# Patient Record
Sex: Male | Born: 1995 | Race: White | Hispanic: No | Marital: Single | Smoking: Never smoker
Health system: Southern US, Community
[De-identification: ages and names within clinical notes are randomized; demographics above are authoritative.]

## PROBLEM LIST (undated history)

## (undated) HISTORY — PX: APPENDECTOMY: SHX54

---

## 2019-05-17 ENCOUNTER — Emergency Department (HOSPITAL_COMMUNITY): Payer: Worker's Compensation

## 2019-05-17 ENCOUNTER — Observation Stay (HOSPITAL_COMMUNITY)
Admission: EM | Admit: 2019-05-17 | Discharge: 2019-05-18 | Disposition: A | Discharge status: Home / Self Care | Payer: Worker's Compensation | Attending: Emergency Medicine | Admitting: Emergency Medicine

## 2019-05-17 ENCOUNTER — Other Ambulatory Visit: Payer: Self-pay

## 2019-05-17 DIAGNOSIS — S02412B LeFort II fracture, initial encounter for open fracture: Secondary | ICD-10-CM | POA: Diagnosis present

## 2019-05-17 DIAGNOSIS — S27322A Contusion of lung, bilateral, initial encounter: Secondary | ICD-10-CM | POA: Insufficient documentation

## 2019-05-17 DIAGNOSIS — Z1159 Encounter for screening for other viral diseases: Secondary | ICD-10-CM | POA: Insufficient documentation

## 2019-05-17 DIAGNOSIS — S0240FA Zygomatic fracture, left side, initial encounter for closed fracture: Secondary | ICD-10-CM | POA: Insufficient documentation

## 2019-05-17 DIAGNOSIS — S0990XA Unspecified injury of head, initial encounter: Secondary | ICD-10-CM | POA: Diagnosis present

## 2019-05-17 DIAGNOSIS — S060X9A Concussion with loss of consciousness of unspecified duration, initial encounter: Secondary | ICD-10-CM | POA: Diagnosis not present

## 2019-05-17 LAB — SARS CORONAVIRUS 2 BY RT PCR (HOSPITAL ORDER, PERFORMED IN ~~LOC~~ HOSPITAL LAB): SARS Coronavirus 2: NEGATIVE

## 2019-05-17 LAB — I-STAT CHEM 8, ED
BUN: 25 mg/dL — ABNORMAL HIGH (ref 6–20)
Calcium, Ion: 1.03 mmol/L — ABNORMAL LOW (ref 1.15–1.40)
Chloride: 106 mmol/L (ref 98–111)
Creatinine, Ser: 0.9 mg/dL (ref 0.61–1.24)
Glucose, Bld: 135 mg/dL — ABNORMAL HIGH (ref 70–99)
HCT: 42 % (ref 39.0–52.0)
Hemoglobin: 14.3 g/dL (ref 13.0–17.0)
Potassium: 4.7 mmol/L (ref 3.5–5.1)
Sodium: 139 mmol/L (ref 135–145)
TCO2: 25 mmol/L (ref 22–32)

## 2019-05-17 LAB — URINALYSIS, ROUTINE W REFLEX MICROSCOPIC
Bacteria, UA: NONE SEEN
Bilirubin Urine: NEGATIVE
Glucose, UA: NEGATIVE mg/dL
Ketones, ur: NEGATIVE mg/dL
Leukocytes,Ua: NEGATIVE
Nitrite: NEGATIVE
Protein, ur: NEGATIVE mg/dL
Specific Gravity, Urine: 1.03 (ref 1.005–1.030)
pH: 6 (ref 5.0–8.0)

## 2019-05-17 LAB — COMPREHENSIVE METABOLIC PANEL
ALT: 14 U/L (ref 0–44)
AST: 22 U/L (ref 15–41)
Albumin: 3.7 g/dL (ref 3.5–5.0)
Alkaline Phosphatase: 49 U/L (ref 38–126)
Anion gap: 9 (ref 5–15)
BUN: 18 mg/dL (ref 6–20)
CO2: 25 mmol/L (ref 22–32)
Calcium: 8.8 mg/dL — ABNORMAL LOW (ref 8.9–10.3)
Chloride: 106 mmol/L (ref 98–111)
Creatinine, Ser: 1.05 mg/dL (ref 0.61–1.24)
GFR calc Af Amer: >60 mL/min (ref >60)
GFR calc non Af Amer: >60 mL/min (ref >60)
Glucose, Bld: 137 mg/dL — ABNORMAL HIGH (ref 70–99)
Potassium: 3.9 mmol/L (ref 3.5–5.1)
Sodium: 140 mmol/L (ref 135–145)
Total Bilirubin: 0.7 mg/dL (ref 0.3–1.2)
Total Protein: 5.8 g/dL — ABNORMAL LOW (ref 6.5–8.1)

## 2019-05-17 LAB — PREPARE FRESH FROZEN PLASMA
Unit division: 0
Unit division: 0

## 2019-05-17 LAB — ABO/RH: ABO/RH(D): O POS

## 2019-05-17 LAB — BPAM FFP
Blood Product Expiration Date: 202008082359
Blood Product Expiration Date: 202008082359
ISSUE DATE / TIME: 202007200914
ISSUE DATE / TIME: 202007200914
Unit Type and Rh: 600
Unit Type and Rh: 6200

## 2019-05-17 LAB — CBC
HCT: 44.9 % (ref 39.0–52.0)
Hemoglobin: 15 g/dL (ref 13.0–17.0)
MCH: 29.8 pg (ref 26.0–34.0)
MCHC: 33.4 g/dL (ref 30.0–36.0)
MCV: 89.3 fL (ref 80.0–100.0)
Platelets: 201 10*3/uL (ref 150–400)
RBC: 5.03 MIL/uL (ref 4.22–5.81)
RDW: 12.4 % (ref 11.5–15.5)
WBC: 10.1 10*3/uL (ref 4.0–10.5)
nRBC: 0 % (ref 0.0–0.2)

## 2019-05-17 LAB — ETHANOL: Alcohol, Ethyl (B): <10 mg/dL (ref <10)

## 2019-05-17 LAB — PROTIME-INR
INR: 1.1 (ref 0.8–1.2)
Prothrombin Time: 14.3 seconds (ref 11.4–15.2)

## 2019-05-17 LAB — LACTIC ACID, PLASMA: Lactic Acid, Venous: 2.5 mmol/L (ref 0.5–1.9)

## 2019-05-17 LAB — CDS SEROLOGY

## 2019-05-17 MED ORDER — OXYCODONE HCL 5 MG PO TABS: 5.0000 mg | ORAL_TABLET | ORAL | Status: DC | PRN

## 2019-05-17 MED ORDER — ONDANSETRON 4 MG PO TBDP: 4.0000 mg | ORAL_TABLET | Freq: Four times a day (QID) | ORAL | Status: DC | PRN

## 2019-05-17 MED ORDER — CLINDAMYCIN PHOSPHATE 600 MG/50ML IV SOLN
600.0000 mg | Freq: Two times a day (BID) | INTRAVENOUS | Status: DC
  Administered 2019-05-17: 600 mg via INTRAVENOUS
  Filled 2019-05-17: qty 50

## 2019-05-17 MED ORDER — HYDROMORPHONE HCL 1 MG/ML IJ SOLN: 1.0000 mg | INTRAMUSCULAR | Status: DC | PRN

## 2019-05-17 MED ORDER — METOPROLOL TARTRATE 5 MG/5ML IV SOLN: 5.0000 mg | Freq: Four times a day (QID) | INTRAVENOUS | Status: DC | PRN

## 2019-05-17 MED ORDER — ACETAMINOPHEN 325 MG PO TABS
650.0000 mg | ORAL_TABLET | Freq: Four times a day (QID) | ORAL | Status: DC
  Administered 2019-05-17 – 2019-05-18 (×3): 650 mg via ORAL
  Filled 2019-05-17 (×3): qty 2

## 2019-05-17 MED ORDER — ONDANSETRON HCL 4 MG/2ML IJ SOLN
INTRAMUSCULAR | Status: AC | PRN
  Administered 2019-05-17: 4 mg via INTRAVENOUS

## 2019-05-17 MED ORDER — OXYMETAZOLINE HCL 0.05 % NA SOLN
1.0000 | NASAL | Status: DC | PRN
  Filled 2019-05-17: qty 30

## 2019-05-17 MED ORDER — ONDANSETRON HCL 4 MG/2ML IJ SOLN
INTRAMUSCULAR | Status: AC
  Filled 2019-05-17: qty 2

## 2019-05-17 MED ORDER — TETANUS-DIPHTH-ACELL PERTUSSIS 5-2.5-18.5 LF-MCG/0.5 IM SUSP
0.5000 mL | Freq: Once | INTRAMUSCULAR | Status: DC
  Filled 2019-05-17: qty 0.5

## 2019-05-17 MED ORDER — ONDANSETRON HCL 4 MG/2ML IJ SOLN: 4.0000 mg | Freq: Four times a day (QID) | INTRAMUSCULAR | Status: DC | PRN

## 2019-05-17 MED ORDER — SODIUM CHLORIDE 0.9 % IV SOLN
INTRAVENOUS | Status: DC
  Administered 2019-05-17 – 2019-05-18 (×2): via INTRAVENOUS

## 2019-05-17 MED ORDER — FENTANYL CITRATE (PF) 100 MCG/2ML IJ SOLN
50.0000 ug | Freq: Once | INTRAMUSCULAR | Status: AC
  Administered 2019-05-17: 14:00:00 50 ug via INTRAVENOUS
  Filled 2019-05-17: qty 2

## 2019-05-17 MED ORDER — IOHEXOL 300 MG/ML  SOLN
75.0000 mL | Freq: Once | INTRAMUSCULAR | Status: AC | PRN
  Administered 2019-05-17: 75 mL via INTRAVENOUS

## 2019-05-17 MED ORDER — FENTANYL CITRATE (PF) 100 MCG/2ML IJ SOLN
INTRAMUSCULAR | Status: AC | PRN
  Administered 2019-05-17: 50 ug via INTRAVENOUS

## 2019-05-17 MED ORDER — CLINDAMYCIN PHOSPHATE 600 MG/50ML IV SOLN
600.0000 mg | Freq: Once | INTRAVENOUS | Status: AC
  Administered 2019-05-17: 13:00:00 600 mg via INTRAVENOUS
  Filled 2019-05-17: qty 50

## 2019-05-17 MED ORDER — FENTANYL CITRATE (PF) 100 MCG/2ML IJ SOLN
INTRAMUSCULAR | Status: AC
  Administered 2019-05-17: 14:00:00 50 ug via INTRAVENOUS
  Filled 2019-05-17: qty 2
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
## 2019-05-17 NOTE — ED Notes (Signed)
Upon assessing patient, it was noted that patient had self removed C-Collar. He stated that he removed it due to discomfort. c-collar replaced at this time and patient educated on keeping collar until doctor gives further orders.
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
## 2019-05-17 NOTE — Plan of Care (Signed)
  Problem: Health Behavior/Discharge Planning: Goal: Ability to manage health-related needs will improve Outcome: Progressing   
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
## 2019-05-17 NOTE — ED Notes (Signed)
Patient transported to CT 
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
## 2019-05-18 ENCOUNTER — Observation Stay (HOSPITAL_COMMUNITY): Payer: Worker's Compensation

## 2019-05-18 LAB — TYPE AND SCREEN
ABO/RH(D): O POS
Antibody Screen: NEGATIVE
Unit division: 0
Unit division: 0

## 2019-05-18 LAB — COMPREHENSIVE METABOLIC PANEL
ALT: 13 U/L (ref 0–44)
AST: 14 U/L — ABNORMAL LOW (ref 15–41)
Albumin: 3.5 g/dL (ref 3.5–5.0)
Alkaline Phosphatase: 46 U/L (ref 38–126)
Anion gap: 9 (ref 5–15)
BUN: 15 mg/dL (ref 6–20)
CO2: 25 mmol/L (ref 22–32)
Calcium: 9 mg/dL (ref 8.9–10.3)
Chloride: 107 mmol/L (ref 98–111)
Creatinine, Ser: 0.89 mg/dL (ref 0.61–1.24)
GFR calc Af Amer: >60 mL/min (ref >60)
GFR calc non Af Amer: >60 mL/min (ref >60)
Glucose, Bld: 91 mg/dL (ref 70–99)
Potassium: 3.6 mmol/L (ref 3.5–5.1)
Sodium: 141 mmol/L (ref 135–145)
Total Bilirubin: 1.5 mg/dL — ABNORMAL HIGH (ref 0.3–1.2)
Total Protein: 5.8 g/dL — ABNORMAL LOW (ref 6.5–8.1)

## 2019-05-18 LAB — CBC
HCT: 38.6 % — ABNORMAL LOW (ref 39.0–52.0)
Hemoglobin: 13.4 g/dL (ref 13.0–17.0)
MCH: 30.2 pg (ref 26.0–34.0)
MCHC: 34.7 g/dL (ref 30.0–36.0)
MCV: 86.9 fL (ref 80.0–100.0)
Platelets: 176 10*3/uL (ref 150–400)
RBC: 4.44 MIL/uL (ref 4.22–5.81)
RDW: 12.7 % (ref 11.5–15.5)
WBC: 14.3 10*3/uL — ABNORMAL HIGH (ref 4.0–10.5)
nRBC: 0 % (ref 0.0–0.2)

## 2019-05-18 LAB — BPAM RBC
Blood Product Expiration Date: 202008172359
Blood Product Expiration Date: 202008172359
ISSUE DATE / TIME: 202007202222
ISSUE DATE / TIME: 202007202222
Unit Type and Rh: 5100
Unit Type and Rh: 5100

## 2019-05-18 LAB — HIV ANTIBODY (ROUTINE TESTING W REFLEX): HIV Screen 4th Generation wRfx: NONREACTIVE

## 2019-05-18 MED ORDER — OXYCODONE HCL 5 MG PO TABS: 5.0000 mg | ORAL_TABLET | Freq: Four times a day (QID) | ORAL | 0 refills | Status: AC | PRN

## 2019-05-18 MED ORDER — ENOXAPARIN SODIUM 30 MG/0.3ML ~~LOC~~ SOLN: 30.0000 mg | SUBCUTANEOUS | Status: DC

## 2019-05-18 MED ORDER — IBUPROFEN 600 MG PO TABS: 600.0000 mg | ORAL_TABLET | Freq: Four times a day (QID) | ORAL | Status: DC | PRN

## 2019-05-18 MED ORDER — ACETAMINOPHEN 325 MG PO TABS: 650.0000 mg | ORAL_TABLET | Freq: Four times a day (QID) | ORAL | Status: AC | PRN

## 2019-05-18 MED ORDER — CEPHALEXIN 500 MG PO CAPS: 500.0000 mg | ORAL_CAPSULE | Freq: Two times a day (BID) | ORAL | 0 refills | Status: AC

## 2019-05-18 NOTE — Progress Notes (Signed)
ENT Follow UP I was able to review the CT scan with the radiologist. Patient has LeFort II fracture on the right side with minimal displacement and essentially no displacement on the left side. He should heal up with minimal cosmetic changes. He should be able to be discharged today but no nose blowing for 5 days, soft diet and antibiotics for 1 week. Keflex 500 mg bid x 1 week. Will have him follow up in my office in 1 week.
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
## 2019-05-18 NOTE — Progress Notes (Addendum)
Patient discharged to home. Verbalizes understanding of all discharge instructions including right side of face/edema, discharge medications, and follow up MD visits. Patient provided with ice pack to take home. Transported down to family by wheelchair.

## 2019-05-18 NOTE — Discharge Instructions (Addendum)
Soft diet only Don't blow your nose hard for the next week. Keflex 500 mg bid for the next week Follow up with Dr Ezzard StandingNewman next Tuesday at 12:45 at his office Apply compress to right side of your face for the next 24 hrs Apply antibiotic ointment to the abrasions   Soft-Food Eating Plan A soft-food eating plan includes foods that are safe and easy to chew and swallow. Your health care provider or dietitian can help you find foods and flavors that fit into this plan. Follow this plan until your health care provider or dietitian says it is safe to start eating other foods and food textures. What are tips for following this plan? General guidelines   Take small bites of food, or cut food into pieces about  inch or smaller. Bite-sized pieces of food are easier to chew and swallow.  Eat moist foods. Avoid overly dry foods.  Avoid foods that: ? Are difficult to swallow, such as dry, chunky, crispy, or sticky foods. ? Are difficult to chew, such as hard, tough, or stringy foods. ? Contain nuts, seeds, or fruits.  Follow instructions from your dietitian about the types of liquids that are safe for you to swallow. You may be allowed to have: ? Thick liquids only. This includes only liquids that are thicker than honey. ? Thin and thick liquids. This includes all beverages and foods that become liquid at room temperature.  To make thick liquids: ? Purchase a commercial liquid thickening powder. These are available at grocery stores and pharmacies. ? Mix the thickener into liquids according to instructions on the label. ? Purchase ready-made thickened liquids. ? Thicken soup by pureeing, straining to remove chunks, and adding flour, potato flakes, or corn starch. ? Add commercial thickener to foods that become liquid at room temperature, such as milk shakes, yogurt, ice cream, gelatin, and sherbet.  Ask your health care provider whether you need to take a fiber supplement. Cooking  Cook meats  so they stay tender and moist. Use methods like braising, stewing, or baking in liquid.  Cook vegetables and fruit until they are soft enough to be mashed with a fork.  Peel soft, fresh fruits such as peaches, nectarines, and melons.  When making soup, make sure chunks of meat and vegetables are smaller than  inch.  Reheat leftover foods slowly so that a tough crust does not form. What foods are allowed? The items listed below may not be a complete list. Talk with your dietitian about what dietary choices are best for you. Grains Breads, muffins, pancakes, or waffles moistened with syrup, jelly, or butter. Dry cereals well-moistened with milk. Moist, cooked cereals. Well-cooked pasta and rice. Vegetables All soft-cooked vegetables. Shredded lettuce. Fruits All canned and cooked fruits. Soft, peeled fresh fruits. Strawberries. Dairy Milk. Cream. Yogurt. Cottage cheese. Soft cheese without the rind. Meats and other protein foods Tender, moist ground meat, poultry, or fish. Meat cooked in gravy or sauces. Eggs. Sweets and desserts Ice cream. Milk shakes. Sherbet. Pudding. Fats and oils Butter. Margarine. Olive, canola, sunflower, and grapeseed oil. Smooth salad dressing. Smooth cream cheese. Mayonnaise. Gravy. What foods are not allowed? The items listed bemay not be a complete list. Talk with your dietitian about what dietary choices are best for you. Grains Coarse or dry cereals, such as bran, granola, and shredded wheat. Tough or chewy crusty breads, such as [place] bread or baguettes. Breads with nuts, seeds, or fruit. Vegetables All raw vegetables. Cooked corn. Cooked vegetables that are  tough or stringy. Tough, crisp, fried potatoes and potato skins. Fruits Fresh fruits with skins or seeds, or both, such as apples, pears, and grapes. Stringy, high-pulp fruits, such as papaya, pineapple, coconut, and mango. Fruit leather and all dried fruit. Dairy Yogurt with nuts or  coconut. Meats and other protein foods Hard, dry sausages. Dry meat, poultry, or fish. Meats with gristle. Fish with bones. Fried meat or fish. Lunch meat and hotdogs. Nuts and seeds. Chunky peanut butter or other nut butters. Sweets and desserts Cakes or cookies that are very dry or chewy. Desserts with dried fruit, nuts, or coconut. Fried pastries. Very rich pastries. Fats and oils Cream cheese with fruit or nuts. Salad dressings with seeds or chunks. Summary  A soft-food eating plan includes foods that are safe and easy to swallow. Generally, the foods should be soft enough to be mashed with a fork.  Avoid foods that are dry, hard to chew, crunchy, sticky, stringy, or crispy.  Ask your health care provider whether you need to thicken your liquids and if you need to take a fiber supplement. This information is not intended to replace advice given to you by your health care provider. Make sure you discuss any questions you have with your health care provider. Document Released: 01/21/2008 Document Revised: 02/04/2019 Document Reviewed: 12/17/2016 Elsevier Patient Education  2020 [employer].

## 2019-06-17 ENCOUNTER — Emergency Department (HOSPITAL_COMMUNITY)
Admission: EM | Admit: 2019-06-17 | Discharge: 2019-06-17 | Disposition: A | Discharge status: Home / Self Care | Payer: Worker's Compensation | Attending: Emergency Medicine | Admitting: Emergency Medicine

## 2019-06-17 ENCOUNTER — Other Ambulatory Visit: Payer: Self-pay

## 2019-06-17 ENCOUNTER — Emergency Department (HOSPITAL_COMMUNITY): Payer: Worker's Compensation

## 2019-06-17 ENCOUNTER — Encounter (HOSPITAL_COMMUNITY): Payer: Self-pay | Admitting: Emergency Medicine

## 2019-06-17 DIAGNOSIS — R0602 Shortness of breath: Secondary | ICD-10-CM | POA: Diagnosis not present

## 2019-06-17 DIAGNOSIS — Z20828 Contact with and (suspected) exposure to other viral communicable diseases: Secondary | ICD-10-CM | POA: Diagnosis not present

## 2019-06-17 LAB — BASIC METABOLIC PANEL
Anion gap: 11 (ref 5–15)
BUN: 20 mg/dL (ref 6–20)
CO2: 24 mmol/L (ref 22–32)
Calcium: 9.9 mg/dL (ref 8.9–10.3)
Chloride: 105 mmol/L (ref 98–111)
Creatinine, Ser: 1.01 mg/dL (ref 0.61–1.24)
GFR calc Af Amer: >60 mL/min (ref >60)
GFR calc non Af Amer: >60 mL/min (ref >60)
Glucose, Bld: 98 mg/dL (ref 70–99)
Potassium: 4.2 mmol/L (ref 3.5–5.1)
Sodium: 140 mmol/L (ref 135–145)

## 2019-06-17 LAB — SARS CORONAVIRUS 2 BY RT PCR (HOSPITAL ORDER, PERFORMED IN ~~LOC~~ HOSPITAL LAB): SARS Coronavirus 2: NEGATIVE

## 2019-06-17 LAB — CBC
HCT: 47 % (ref 39.0–52.0)
Hemoglobin: 15.7 g/dL (ref 13.0–17.0)
MCH: 29.8 pg (ref 26.0–34.0)
MCHC: 33.4 g/dL (ref 30.0–36.0)
MCV: 89.2 fL (ref 80.0–100.0)
Platelets: 213 10*3/uL (ref 150–400)
RBC: 5.27 MIL/uL (ref 4.22–5.81)
RDW: 12.6 % (ref 11.5–15.5)
WBC: 8.4 10*3/uL (ref 4.0–10.5)
nRBC: 0 % (ref 0.0–0.2)

## 2019-06-17 LAB — TROPONIN I (HIGH SENSITIVITY)
Troponin I (High Sensitivity): 2 ng/L (ref <18)
Troponin I (High Sensitivity): 2 ng/L (ref <18)

## 2019-06-17 MED ORDER — SODIUM CHLORIDE 0.9% FLUSH: 3.0000 mL | Freq: Once | INTRAVENOUS | Status: DC

## 2019-06-17 MED ORDER — IOHEXOL 350 MG/ML SOLN
100.0000 mL | Freq: Once | INTRAVENOUS | Status: AC | PRN
  Administered 2019-06-17: 100 mL via INTRAVENOUS

## 2019-06-17 NOTE — ED Notes (Signed)
MD Diona Browner called to request Covid testing prior to surgery scheduled for tmrw.

## 2019-06-17 NOTE — Pre-Procedure Instructions (Signed)
    [facility]  06/17/2019      [id number] [address], [place] [address] Phone: [phone] Fax: [phone]    Your procedure is scheduled on Friday, June 18, 2019  Report to [facility] Admitting at 5: 30 A.M.  Call this number if you have problems the morning of surgery:  [phone]   Remember: Brush your teeth the morning of surgery with your regular toothpaste.   Do not eat or drink after midnight.     Take these medicines the morning of surgery with A SIP OF WATER :  If needed: acetaminophen (TYLENOL) for pain or headache  Stop taking Aspirin (unless otherwise advised by surgeon), vitamins, fish oil and herbal medications. Do not take any NSAIDs ie: Ibuprofen, Advil, Naproxen (Aleve), Motrin, BC and Goody Powder; stop now.   Do not wear jewelry, make-up or nail polish.  Do not wear lotions, powders, or perfumes, or deodorant.  Do not shave 48 hours prior to surgery.  Men may shave face and neck.  Do not bring valuables to the hospital.  [facility] is not responsible for any belongings or valuables.  Contacts, dentures or bridgework may not be worn into surgery.   For patients admitted to the hospital, discharge time will be determined by your treatment team.  Patients discharged the day of surgery will not be allowed to drive home.  Please read over the following fact sheets that you were given.
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
## 2019-06-17 NOTE — ED Notes (Signed)
Patient Alert and oriented to baseline. Stable and ambulatory to baseline. Patient verbalized understanding of the discharge instructions.  Patient belongings were taken by the patient.   

## 2019-06-17 NOTE — Discharge Instructions (Signed)
Please follow up with your primary care provider within 5-7 days for re-evaluation of your symptoms. If you do not have a primary care provider, information for a healthcare clinic has been provided for you to make arrangements for follow up care. Please return to the emergency department for any new or worsening symptoms. ° °
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
## 2019-06-17 NOTE — Progress Notes (Signed)
Pt currently in the ED. PAT nurse provided pt ED nurse, Lennette Bihari, with a pre-op instruction sheet for pt surgery on 06/18/19. Complete pt assessment on DOS.

## 2019-06-18 ENCOUNTER — Encounter (HOSPITAL_COMMUNITY): Payer: Self-pay | Admitting: Certified Registered"

## 2019-06-18 ENCOUNTER — Ambulatory Visit (HOSPITAL_COMMUNITY): Admission: RE | Admit: 2019-06-18 | Payer: Worker's Compensation | Source: Home / Self Care | Admitting: Oral Surgery

## 2019-06-18 SURGERY — OPEN REDUCTION INTERNAL FIXATION (ORIF) MANDIBULAR FRACTURE
Anesthesia: General

## 2019-06-21 ENCOUNTER — Telehealth: Payer: Self-pay
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
## 2019-06-22 ENCOUNTER — Ambulatory Visit (INDEPENDENT_AMBULATORY_CARE_PROVIDER_SITE_OTHER): Payer: Worker's Compensation | Admitting: Family Medicine

## 2019-06-22 ENCOUNTER — Encounter: Payer: Self-pay | Admitting: Family Medicine

## 2019-06-22 DIAGNOSIS — F32A Depression, unspecified: Secondary | ICD-10-CM | POA: Insufficient documentation

## 2019-06-22 DIAGNOSIS — R2689 Other abnormalities of gait and mobility: Secondary | ICD-10-CM

## 2019-06-22 DIAGNOSIS — F329 Major depressive disorder, single episode, unspecified: Secondary | ICD-10-CM

## 2019-06-22 MED ORDER — VENLAFAXINE HCL ER 37.5 MG PO CP24: 37.5000 mg | ORAL_CAPSULE | Freq: Every day | ORAL | 0 refills | Status: AC

## 2019-06-22 MED ORDER — HYDROXYZINE PAMOATE 25 MG PO CAPS: 25.0000 mg | ORAL_CAPSULE | Freq: Three times a day (TID) | ORAL | 0 refills | Status: AC | PRN
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
## 2019-07-09 ENCOUNTER — Ambulatory Visit: Payer: Self-pay | Admitting: Family Medicine

## 2019-07-12 ENCOUNTER — Encounter: Payer: Self-pay | Admitting: Family Medicine

## 2019-07-12 ENCOUNTER — Ambulatory Visit (INDEPENDENT_AMBULATORY_CARE_PROVIDER_SITE_OTHER): Payer: Worker's Compensation | Admitting: Family Medicine

## 2019-07-12 DIAGNOSIS — F329 Major depressive disorder, single episode, unspecified: Secondary | ICD-10-CM | POA: Diagnosis not present

## 2019-07-12 DIAGNOSIS — F32A Depression, unspecified: Secondary | ICD-10-CM

## 2019-07-12 MED ORDER — HYDROXYZINE HCL 10 MG PO TABS: 10.0000 mg | ORAL_TABLET | Freq: Three times a day (TID) | ORAL | 0 refills | Status: AC | PRN
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
## 2019-07-22 ENCOUNTER — Ambulatory Visit: Payer: Self-pay | Admitting: Psychology
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
